# Patient Record
Sex: Female | Born: 1940 | Race: Black or African American | Hispanic: No | Smoking: Former smoker
Health system: Southern US, Community
[De-identification: ages and names within clinical notes are randomized; demographics above are authoritative.]

## PROBLEM LIST (undated history)

## (undated) DIAGNOSIS — I10 Essential (primary) hypertension: Secondary | ICD-10-CM

## (undated) DIAGNOSIS — E119 Type 2 diabetes mellitus without complications: Secondary | ICD-10-CM

## (undated) HISTORY — PX: ABDOMINAL HYSTERECTOMY: SHX81

---

## 2016-01-24 ENCOUNTER — Encounter (HOSPITAL_BASED_OUTPATIENT_CLINIC_OR_DEPARTMENT_OTHER): Payer: Self-pay

## 2016-01-24 ENCOUNTER — Emergency Department (HOSPITAL_BASED_OUTPATIENT_CLINIC_OR_DEPARTMENT_OTHER): Payer: Medicare Other

## 2016-01-24 ENCOUNTER — Emergency Department (HOSPITAL_BASED_OUTPATIENT_CLINIC_OR_DEPARTMENT_OTHER)
Admission: EM | Admit: 2016-01-24 | Discharge: 2016-01-24 | Disposition: A | Discharge status: Home / Self Care | Payer: Medicare Other | Attending: Emergency Medicine | Admitting: Emergency Medicine

## 2016-01-24 DIAGNOSIS — I10 Essential (primary) hypertension: Secondary | ICD-10-CM | POA: Insufficient documentation

## 2016-01-24 DIAGNOSIS — K8020 Calculus of gallbladder without cholecystitis without obstruction: Secondary | ICD-10-CM | POA: Diagnosis not present

## 2016-01-24 DIAGNOSIS — E119 Type 2 diabetes mellitus without complications: Secondary | ICD-10-CM | POA: Insufficient documentation

## 2016-01-24 DIAGNOSIS — Z87891 Personal history of nicotine dependence: Secondary | ICD-10-CM | POA: Diagnosis not present

## 2016-01-24 DIAGNOSIS — R1010 Upper abdominal pain, unspecified: Secondary | ICD-10-CM

## 2016-01-24 DIAGNOSIS — Z7984 Long term (current) use of oral hypoglycemic drugs: Secondary | ICD-10-CM | POA: Diagnosis not present

## 2016-01-24 DIAGNOSIS — R1011 Right upper quadrant pain: Secondary | ICD-10-CM | POA: Diagnosis present

## 2016-01-24 HISTORY — DX: Type 2 diabetes mellitus without complications: E11.9

## 2016-01-24 HISTORY — DX: Essential (primary) hypertension: I10

## 2016-01-24 LAB — URINALYSIS, ROUTINE W REFLEX MICROSCOPIC
Bilirubin Urine: NEGATIVE
Glucose, UA: 100 mg/dL — AB
Ketones, ur: NEGATIVE mg/dL
LEUKOCYTES UA: NEGATIVE
NITRITE: NEGATIVE
Protein, ur: >300 mg/dL — AB
SPECIFIC GRAVITY, URINE: 1.02 (ref 1.005–1.030)
pH: 6 (ref 5.0–8.0)

## 2016-01-24 LAB — COMPREHENSIVE METABOLIC PANEL
ALBUMIN: 3.6 g/dL (ref 3.5–5.0)
ALK PHOS: 51 U/L (ref 38–126)
ALT: 6 U/L — AB (ref 14–54)
AST: 14 U/L — AB (ref 15–41)
Anion gap: 7 (ref 5–15)
BUN: 56 mg/dL — ABNORMAL HIGH (ref 6–20)
CALCIUM: 9.3 mg/dL (ref 8.9–10.3)
CO2: 20 mmol/L — AB (ref 22–32)
CREATININE: 2.79 mg/dL — AB (ref 0.44–1.00)
Chloride: 112 mmol/L — ABNORMAL HIGH (ref 101–111)
GFR calc Af Amer: 18 mL/min — ABNORMAL LOW (ref >60)
GFR calc non Af Amer: 15 mL/min — ABNORMAL LOW (ref >60)
GLUCOSE: 152 mg/dL — AB (ref 65–99)
Potassium: 4.7 mmol/L (ref 3.5–5.1)
SODIUM: 139 mmol/L (ref 135–145)
Total Bilirubin: 0.2 mg/dL — ABNORMAL LOW (ref 0.3–1.2)
Total Protein: 7.5 g/dL (ref 6.5–8.1)

## 2016-01-24 LAB — URINE MICROSCOPIC-ADD ON

## 2016-01-24 LAB — CBC WITH DIFFERENTIAL/PLATELET
Basophils Absolute: 0 10*3/uL (ref 0.0–0.1)
Basophils Relative: 0 %
EOS ABS: 0.2 10*3/uL (ref 0.0–0.7)
EOS PCT: 3 %
HCT: 30 % — ABNORMAL LOW (ref 36.0–46.0)
HEMOGLOBIN: 9.6 g/dL — AB (ref 12.0–15.0)
LYMPHS ABS: 1.7 10*3/uL (ref 0.7–4.0)
Lymphocytes Relative: 29 %
MCH: 25.6 pg — AB (ref 26.0–34.0)
MCHC: 32 g/dL (ref 30.0–36.0)
MCV: 80 fL (ref 78.0–100.0)
MONOS PCT: 9 %
Monocytes Absolute: 0.5 10*3/uL (ref 0.1–1.0)
Neutro Abs: 3.4 10*3/uL (ref 1.7–7.7)
Neutrophils Relative %: 59 %
PLATELETS: 297 10*3/uL (ref 150–400)
RBC: 3.75 MIL/uL — ABNORMAL LOW (ref 3.87–5.11)
RDW: 16.1 % — ABNORMAL HIGH (ref 11.5–15.5)
WBC: 5.7 10*3/uL (ref 4.0–10.5)

## 2016-01-24 LAB — LIPASE, BLOOD: Lipase: 51 U/L (ref 11–51)

## 2016-01-24 MED ORDER — SODIUM CHLORIDE 0.9 % IV BOLUS (SEPSIS)
1000.0000 mL | Freq: Once | INTRAVENOUS | Status: AC
  Administered 2016-01-24: 1000 mL via INTRAVENOUS

## 2016-01-24 MED ORDER — SODIUM CHLORIDE 0.9 % IV BOLUS (SEPSIS)
500.0000 mL | Freq: Once | INTRAVENOUS | Status: AC
  Administered 2016-01-24: 500 mL via INTRAVENOUS

## 2016-01-24 MED ORDER — AMLODIPINE BESYLATE 5 MG PO TABS: 5.0000 mg | ORAL_TABLET | Freq: Every day | ORAL | Status: AC

## 2016-01-24 MED ORDER — HYDROCHLOROTHIAZIDE 25 MG PO TABS: 25.0000 mg | ORAL_TABLET | Freq: Every day | ORAL | Status: AC

## 2016-01-24 NOTE — ED Provider Notes (Signed)
CSN: [id number]     Arrival date & time 01/24/16  1402 History  By signing my name below, I, [facility], attest that this documentation has been prepared under the direction and in the presence of Sherwood Gambler, MD. Electronically Signed: Virgel Bouquet, ED Scribe. 01/24/2016. 5:22 PM.   Chief Complaint  Patient presents with  . Abdominal Pain   The history is provided by the patient and a relative. No language interpreter was used.   HPI Comments: Paula Mcdaniel is a 75 y.o. female with an hx of DM and HTN who presents to the Emergency Department complaining of intermittent, 5/10 RUQ onset 1 week ago. She notes that the pain radiates from her RUQ down to her RLQ and wraps around to her suprapubic abdomen. Pt states that the pain lasts for 5 minutes before resolving without treatment. Pain is unchanged by eating and palpation. She has taken Tylenol, most recently yesterday, with slight relief. She takes both lisinopril and metformin as prescribed. Daughter reports that the pt mostly drinks [employer] and does not drink much water. Denies hx of kidney conditions. She does not have a PCP currently but daughter notes that the pt has an appointment next week with [facility]. Denies nausea, vomiting, diarrhea, dysuria, hematuria, cough, CP, back pain, numbness, and weakness.  Past Medical History  Diagnosis Date  . Diabetes mellitus without complication ([place])   . Hypertension    Past Surgical History  Procedure Laterality Date  . Abdominal hysterectomy     No family history on file. Social History  Substance Use Topics  . Smoking status: Former Research scientist (life sciences)  . Smokeless tobacco: None  . Alcohol Use: No   OB History    No data available     Review of Systems  Respiratory: Negative for cough.   Cardiovascular: Negative for chest pain.  Gastrointestinal: Positive for abdominal pain. Negative for nausea, vomiting and diarrhea.  Genitourinary: Negative for dysuria and hematuria.   Musculoskeletal: Negative for back pain.  Neurological: Negative for weakness and numbness.  All other systems reviewed and are negative.   Allergies  Penicillins  Home Medications   Prior to Admission medications   Medication Sig Start Date End Date Taking? Authorizing Provider  lisinopril (PRINIVIL,ZESTRIL) 20 MG tablet Take 20 mg by mouth daily.   Yes Historical Provider, MD  metFORMIN (GLUCOPHAGE) 500 MG tablet Take by mouth 2 (two) times daily with a meal.   Yes Historical Provider, MD   BP 201/95 mmHg  Pulse 94  Temp(Src) 99.1 F (37.3 C) (Oral)  Resp 18  Ht 5\' 7"  (1.702 m)  Wt 207 lb (93.895 kg)  BMI 32.41 kg/m2  SpO2 100% Physical Exam  Constitutional: She is oriented to person, place, and time. She appears well-developed and well-nourished.  HENT:  Head: Normocephalic and atraumatic.  Right Ear: External ear normal.  Left Ear: External ear normal.  Nose: Nose normal.  Eyes: Right eye exhibits no discharge. Left eye exhibits no discharge.  Cardiovascular: Normal rate, regular rhythm and normal heart sounds.   Pulmonary/Chest: Effort normal and breath sounds normal.  Abdominal: Soft. There is no tenderness.  No abdominal tenderness.  Neurological: She is alert and oriented to person, place, and time.  Skin: Skin is warm and dry.  Nursing note and vitals reviewed.   ED Course  Procedures   DIAGNOSTIC STUDIES: Oxygen Saturation is 100% on RA, normal by my interpretation.    COORDINATION OF CARE: 3:40 PM Ordered labs. Will order renal stone study  CT and IV fluids. Discussed ordering pain medication and pt declined at this time. Discussed treatment plan with pt at bedside and pt agreed to plan.  5:14 PM Returned to speak with pt about lab and advanced imaging results.  Labs Review Labs Reviewed  URINALYSIS, ROUTINE W REFLEX MICROSCOPIC (NOT AT [facility]) - Abnormal; Notable for the following:    Glucose, UA 100 (*)    Hgb urine dipstick SMALL (*)    Protein,  ur >300 (*)    All other components within normal limits  COMPREHENSIVE METABOLIC PANEL - Abnormal; Notable for the following:    Chloride 112 (*)    CO2 20 (*)    Glucose, Bld 152 (*)    BUN 56 (*)    Creatinine, Ser 2.79 (*)    AST 14 (*)    ALT 6 (*)    Total Bilirubin 0.2 (*)    GFR calc non Af Amer 15 (*)    GFR calc Af Amer 18 (*)    All other components within normal limits  CBC WITH DIFFERENTIAL/PLATELET - Abnormal; Notable for the following:    RBC 3.75 (*)    Hemoglobin 9.6 (*)    HCT 30.0 (*)    MCH 25.6 (*)    RDW 16.1 (*)    All other components within normal limits  URINE MICROSCOPIC-ADD ON - Abnormal; Notable for the following:    Squamous Epithelial / LPF 6-30 (*)    Bacteria, UA FEW (*)    Casts HYALINE CASTS (*)    All other components within normal limits  LIPASE, BLOOD    Imaging Review Ct Renal Stone Study  01/24/2016  CLINICAL DATA:  Acute right lower quadrant abdominal pain. EXAM: CT ABDOMEN AND PELVIS WITHOUT CONTRAST TECHNIQUE: Multidetector CT imaging of the abdomen and pelvis was performed following the standard protocol without IV contrast. COMPARISON:  None. FINDINGS: Visualized lung bases are unremarkable. No significant osseous abnormality is noted. Minimal cholelithiasis is noted. No evidence of cholecystitis is noted. No focal abnormality is noted in the liver, spleen or pancreas on these unenhanced images. Adrenal glands appear normal. No hydronephrosis or renal obstruction is noted. No renal or ureteral calculi are noted. Atherosclerosis of abdominal aorta is noted without aneurysm formation. There is no evidence of bowel obstruction. Small exophytic right renal cyst is noted. The appendix is not visualized. No abnormal fluid collection is noted. Urinary bladder appears normal. Status post hysterectomy. Ovaries are not definitively visualized. IMPRESSION: Minimal cholelithiasis. Atherosclerosis of abdominal aorta without aneurysm formation. No  hydronephrosis or renal obstruction is noted. No renal or ureteral calculi are noted. Electronically Signed   By: Marijo Conception, M.D.   On: 01/24/2016 16:36   I have personally reviewed and evaluated these images and lab results as part of my medical decision-making.   EKG Interpretation None      MDM   Final diagnoses:  Upper abdominal pain  Calculus of gallbladder without cholecystitis without obstruction  Essential hypertension    Patient currently has minimal pain and declines pain meds. Has elevated Cr and anemia, but these are not far from baseline per Care Everywhere. Had BUN/Cr 49/2.2; Hgb 10.4 in 05/2015. She does not appear significantly volume depleted but given bump in creatinine was given IV fluids. She is tolerating oral well but doesn't drink much water, discussed importance of adequate hydration. She is also hypertensive. Given increasing creatinine, will d/c lisinopril and put on amlodipine (states she's done well with this in  past) and HCTZ. Discussed importance of PCP. Pain could possibly be coming from cholelithiasis. LFT and lipase benign. No chest symptoms. Given she feels well, will d/c with recommendations to f/u with surgeon.   I personally performed the services described in this documentation, which was scribed in my presence. The recorded information has been reviewed and is accurate.    Sherwood Gambler, MD 01/25/16 [id number]

## 2016-01-24 NOTE — ED Notes (Signed)
Pt placed on auto vitals Q30.  

## 2016-01-24 NOTE — ED Notes (Signed)
C/o right side abd pain intermittent x 1 week-denies n/v/d-presents to triage in w/c-uses cane-NAD

## 2016-01-24 NOTE — ED Notes (Signed)
Patient transported to CT 

## 2016-12-23 IMAGING — CT CT RENAL STONE PROTOCOL
2 of 4 series · 17 of 46 positions shown, 19 images · non-contrast
Comparison: None.

CLINICAL DATA: Acute right lower quadrant abdominal pain.

EXAM:
CT ABDOMEN AND PELVIS WITHOUT CONTRAST
TECHNIQUE: Multidetector CT imaging of the abdomen and pelvis was performed
following the standard protocol without IV contrast.

[Series 2: axial st · axial · 0.85mm/px · z∈[-422,-7]mm · 14 of 91 slices shown, 16 images]
[im 4/91  soft-tissue]
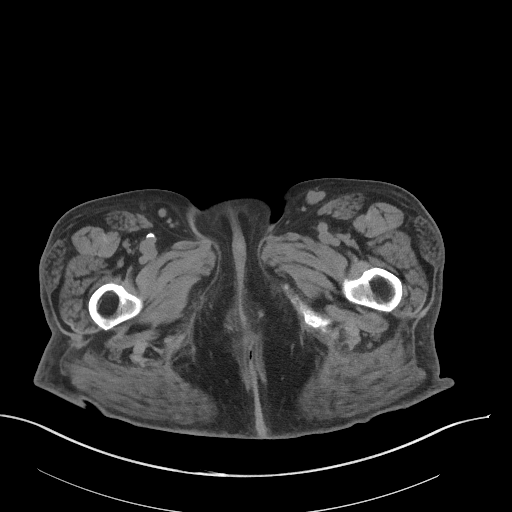
[im 4/91  bone]
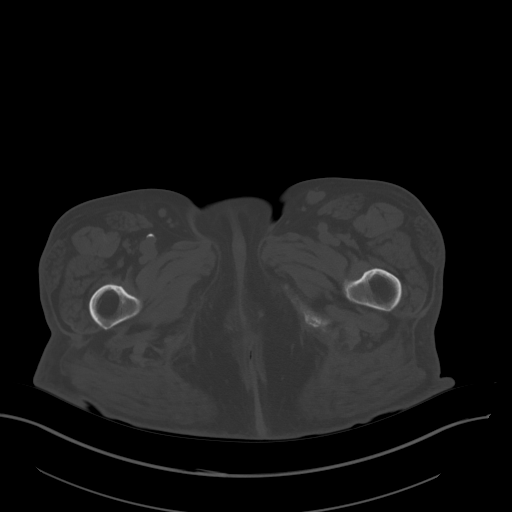
[im 12/91  soft-tissue]
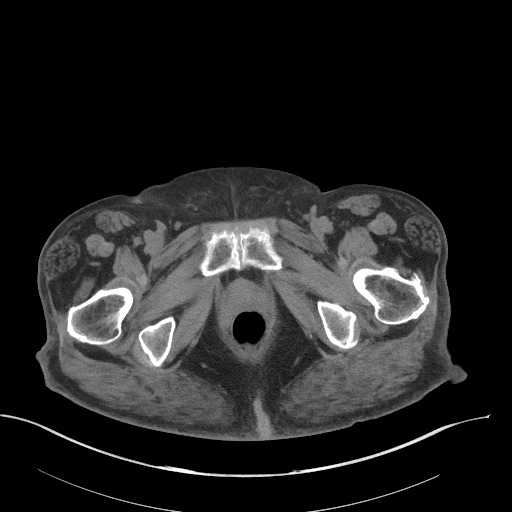
[im 19/91  soft-tissue]
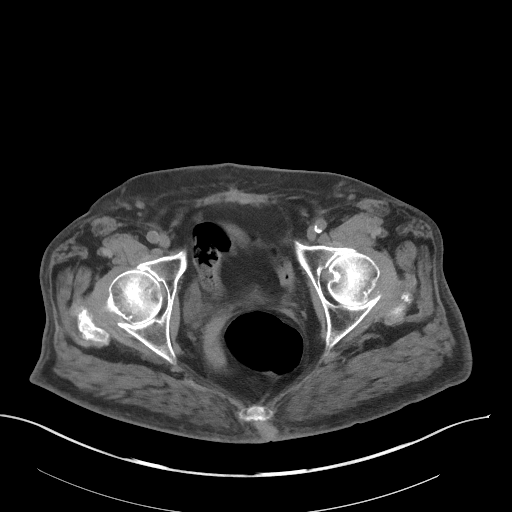
[im 23/91  soft-tissue]
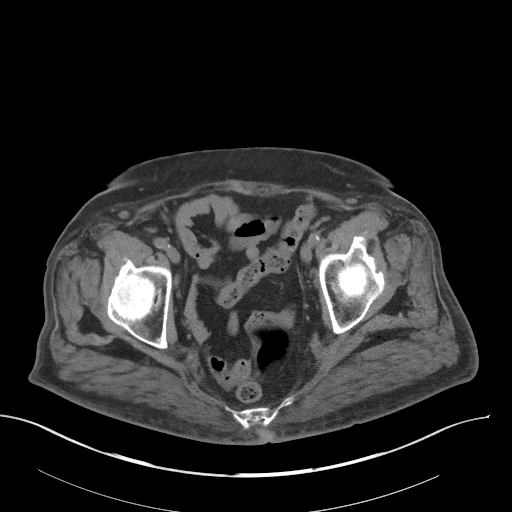
[im 31/91  soft-tissue]
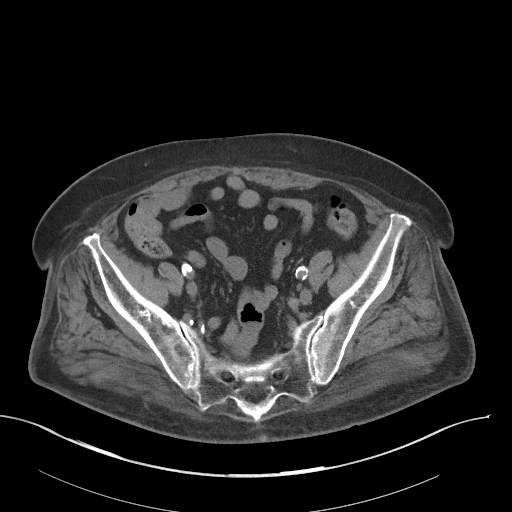
[im 38/91  soft-tissue]
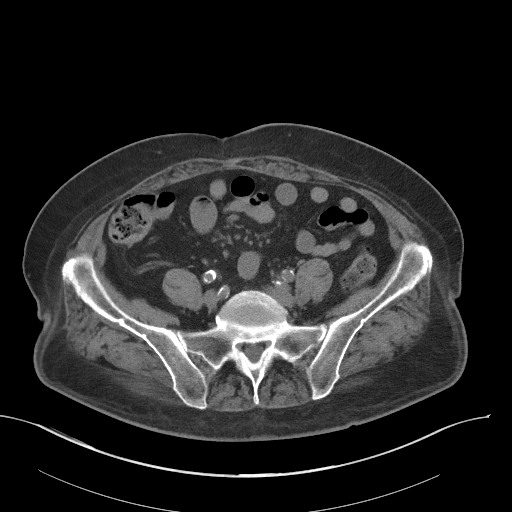
[im 42/91  soft-tissue]
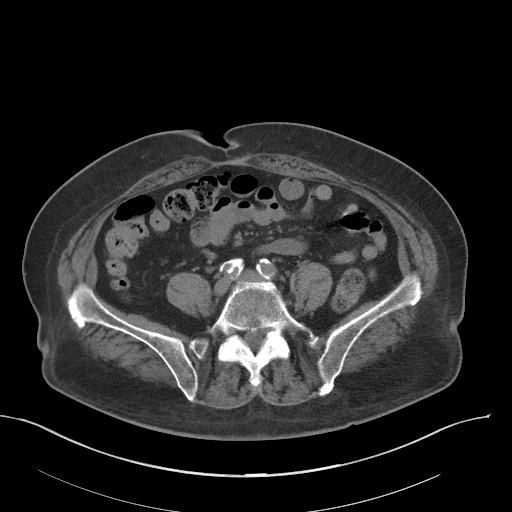
[im 49/91  soft-tissue]
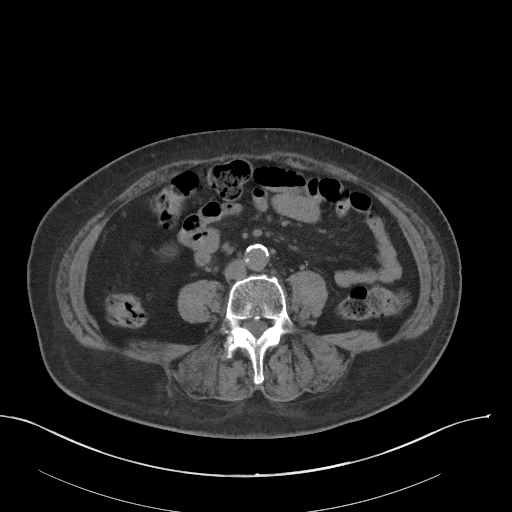
[im 53/91  soft-tissue]
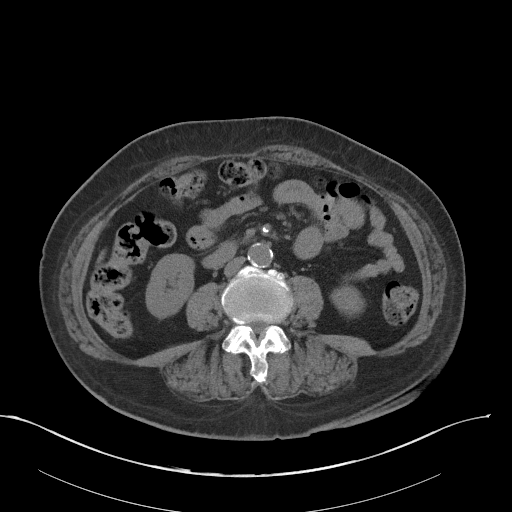
[im 53/91  bone]
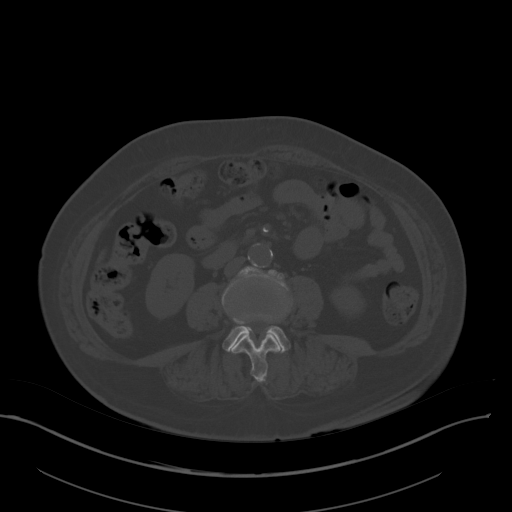
[im 61/91  soft-tissue]
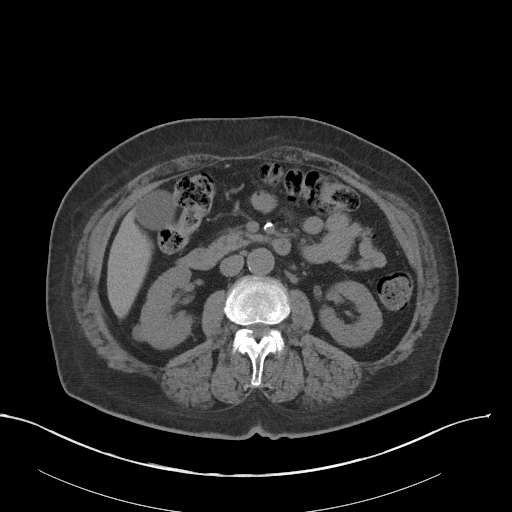
[im 68/91  soft-tissue]
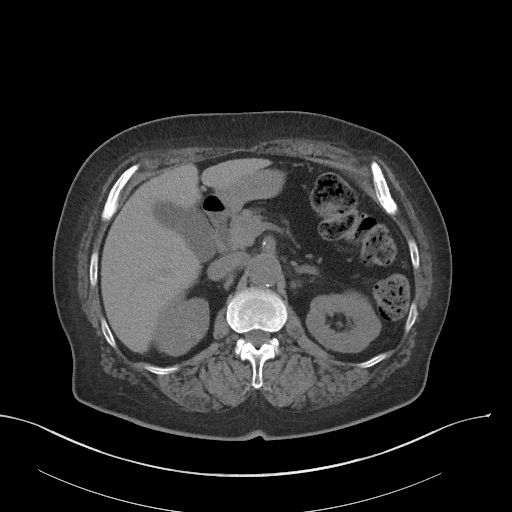
[im 72/91  soft-tissue]
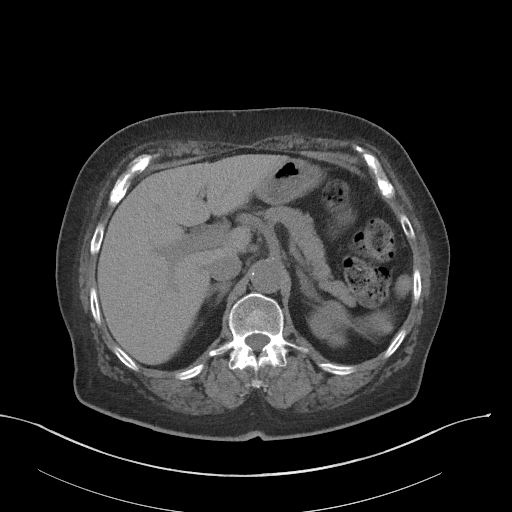
[im 79/91  soft-tissue]
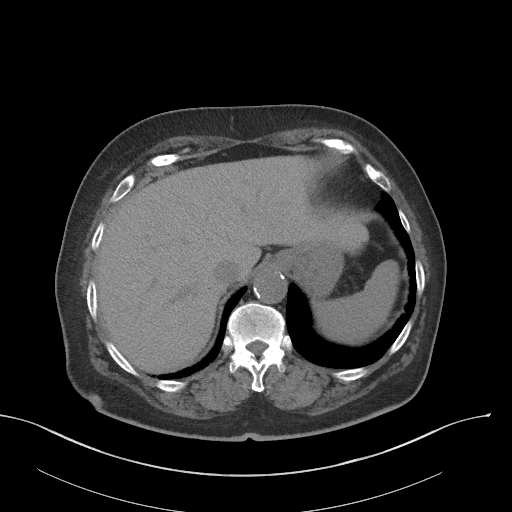
[im 87/91  soft-tissue]
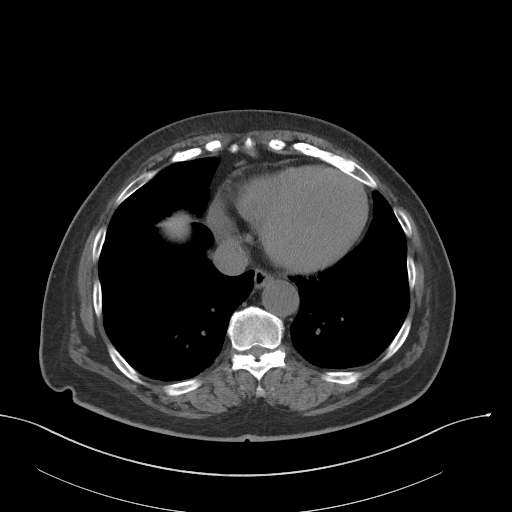

[Series 4: coronal st · coronal · 0.93mm/px · 3 of 87 slices shown]
[im 29/87  soft-tissue]
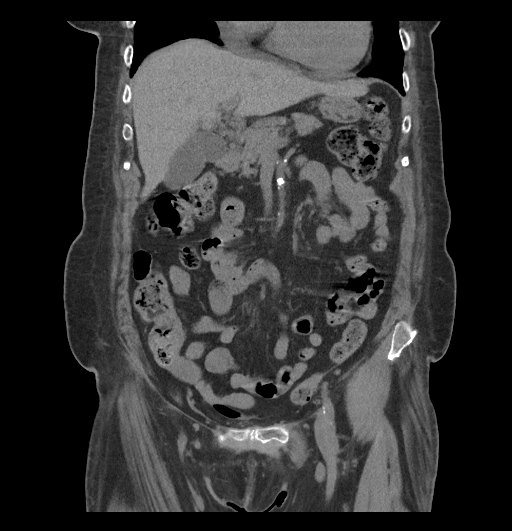
[im 39/87  soft-tissue]
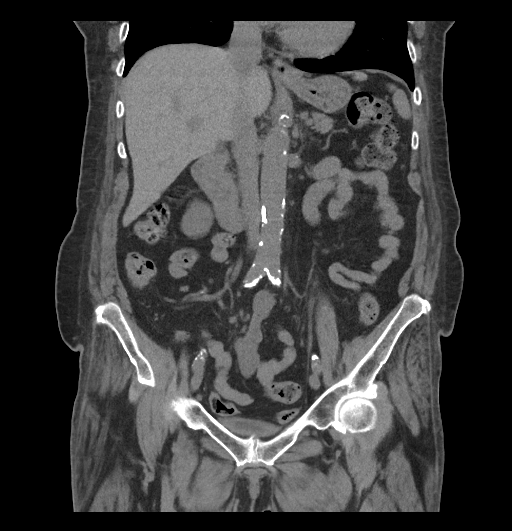
[im 48/87  soft-tissue]
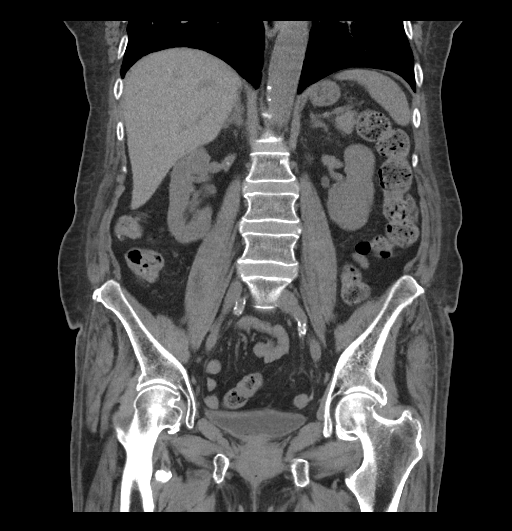

[17 of 46 positions shown; findings below may reference images not displayed]

FINDINGS: Visualized lung bases are unremarkable. No significant osseous
abnormality is noted.

Minimal cholelithiasis is noted. No evidence of cholecystitis is
noted. No focal abnormality is noted in the liver, spleen or
pancreas on these unenhanced images. Adrenal glands appear normal.
No hydronephrosis or renal obstruction is noted. No renal or
ureteral calculi are noted. Atherosclerosis of abdominal aorta is
noted without aneurysm formation. There is no evidence of bowel
obstruction. Small exophytic right renal cyst is noted. The appendix
is not visualized. No abnormal fluid collection is noted. Urinary
bladder appears normal. Status post hysterectomy. Ovaries are not
definitively visualized.
IMPRESSION: Minimal cholelithiasis.

Atherosclerosis of abdominal aorta without aneurysm formation.

No hydronephrosis or renal obstruction is noted. No renal or
ureteral calculi are noted.

## 2018-04-05 ENCOUNTER — Other Ambulatory Visit: Payer: Self-pay

## 2018-04-05 ENCOUNTER — Encounter (HOSPITAL_COMMUNITY): Payer: Self-pay

## 2018-04-05 ENCOUNTER — Emergency Department (HOSPITAL_COMMUNITY): Payer: Medicare Other

## 2018-04-05 ENCOUNTER — Emergency Department (HOSPITAL_COMMUNITY)
Admission: EM | Admit: 2018-04-05 | Discharge: 2018-04-05 | Disposition: A | Discharge status: Home / Self Care | Payer: Medicare Other | Attending: Emergency Medicine | Admitting: Emergency Medicine

## 2018-04-05 DIAGNOSIS — I10 Essential (primary) hypertension: Secondary | ICD-10-CM | POA: Insufficient documentation

## 2018-04-05 DIAGNOSIS — R5383 Other fatigue: Secondary | ICD-10-CM | POA: Insufficient documentation

## 2018-04-05 DIAGNOSIS — Z87891 Personal history of nicotine dependence: Secondary | ICD-10-CM | POA: Diagnosis not present

## 2018-04-05 DIAGNOSIS — Z79899 Other long term (current) drug therapy: Secondary | ICD-10-CM | POA: Diagnosis not present

## 2018-04-05 DIAGNOSIS — C159 Malignant neoplasm of esophagus, unspecified: Secondary | ICD-10-CM

## 2018-04-05 DIAGNOSIS — M10031 Idiopathic gout, right wrist: Secondary | ICD-10-CM | POA: Insufficient documentation

## 2018-04-05 DIAGNOSIS — E119 Type 2 diabetes mellitus without complications: Secondary | ICD-10-CM | POA: Diagnosis not present

## 2018-04-05 DIAGNOSIS — M109 Gout, unspecified: Secondary | ICD-10-CM

## 2018-04-05 DIAGNOSIS — R531 Weakness: Secondary | ICD-10-CM | POA: Insufficient documentation

## 2018-04-05 DIAGNOSIS — M10071 Idiopathic gout, right ankle and foot: Secondary | ICD-10-CM | POA: Diagnosis not present

## 2018-04-05 LAB — COMPREHENSIVE METABOLIC PANEL
ALT: 18 U/L (ref 0–44)
ANION GAP: 13 (ref 5–15)
AST: 29 U/L (ref 15–41)
Albumin: 2.8 g/dL — ABNORMAL LOW (ref 3.5–5.0)
Alkaline Phosphatase: 64 U/L (ref 38–126)
BUN: 67 mg/dL — ABNORMAL HIGH (ref 8–23)
CHLORIDE: 110 mmol/L (ref 98–111)
CO2: 16 mmol/L — AB (ref 22–32)
CREATININE: 4.38 mg/dL — AB (ref 0.44–1.00)
Calcium: 8.6 mg/dL — ABNORMAL LOW (ref 8.9–10.3)
GFR calc non Af Amer: 9 mL/min — ABNORMAL LOW (ref >60)
GFR, EST AFRICAN AMERICAN: 10 mL/min — AB (ref >60)
Glucose, Bld: 101 mg/dL — ABNORMAL HIGH (ref 70–99)
POTASSIUM: 5 mmol/L (ref 3.5–5.1)
SODIUM: 139 mmol/L (ref 135–145)
Total Bilirubin: 0.6 mg/dL (ref 0.3–1.2)
Total Protein: 6.5 g/dL (ref 6.5–8.1)

## 2018-04-05 LAB — URINALYSIS, ROUTINE W REFLEX MICROSCOPIC
Bacteria, UA: NONE SEEN
Bilirubin Urine: NEGATIVE
GLUCOSE, UA: 50 mg/dL — AB
Hgb urine dipstick: NEGATIVE
KETONES UR: NEGATIVE mg/dL
Leukocytes, UA: NEGATIVE
Nitrite: NEGATIVE
PH: 5 (ref 5.0–8.0)
Protein, ur: >=300 mg/dL — AB
SPECIFIC GRAVITY, URINE: 1.014 (ref 1.005–1.030)

## 2018-04-05 LAB — CBC WITH DIFFERENTIAL/PLATELET
BASOS ABS: 0 10*3/uL (ref 0.0–0.1)
Basophils Relative: 0 %
EOS ABS: 0 10*3/uL (ref 0.0–0.7)
Eosinophils Relative: 0 %
HEMATOCRIT: 25.9 % — AB (ref 36.0–46.0)
Hemoglobin: 7.9 g/dL — ABNORMAL LOW (ref 12.0–15.0)
LYMPHS ABS: 0.6 10*3/uL — AB (ref 0.7–4.0)
Lymphocytes Relative: 3 %
MCH: 26.2 pg (ref 26.0–34.0)
MCHC: 30.5 g/dL (ref 30.0–36.0)
MCV: 86 fL (ref 78.0–100.0)
MONOS PCT: 3 %
Monocytes Absolute: 0.6 10*3/uL (ref 0.1–1.0)
Neutro Abs: 17.5 10*3/uL — ABNORMAL HIGH (ref 1.7–7.7)
Neutrophils Relative %: 94 %
Platelets: 201 10*3/uL (ref 150–400)
RBC: 3.01 MIL/uL — AB (ref 3.87–5.11)
RDW: 18.7 % — ABNORMAL HIGH (ref 11.5–15.5)
WBC: 18.7 10*3/uL — AB (ref 4.0–10.5)

## 2018-04-05 LAB — I-STAT CG4 LACTIC ACID, ED: LACTIC ACID, VENOUS: 0.55 mmol/L (ref 0.5–1.9)

## 2018-04-05 LAB — PROTIME-INR
INR: 1.27
PROTHROMBIN TIME: 15.8 s — AB (ref 11.4–15.2)

## 2018-04-05 MED ORDER — OXYCODONE-ACETAMINOPHEN 5-325 MG PO TABS
1.0000 | ORAL_TABLET | Freq: Once | ORAL | Status: AC
Start: 2018-04-05 — End: 2018-04-05
  Administered 2018-04-05: 1 via ORAL
  Filled 2018-04-05: qty 1

## 2018-04-05 MED ORDER — OXYCODONE HCL 5 MG PO TABS: 2.5000 mg | ORAL_TABLET | ORAL | 0 refills | Status: AC | PRN

## 2018-04-05 NOTE — ED Provider Notes (Signed)
[facility] EMERGENCY DEPARTMENT Provider Note  CSN: [id number] Arrival date & time: 04/05/18 0145  Chief Complaint(s) Fatigue and Ankle Pain  HPI Paula Mcdaniel is a 77 y.o. female a history of hypertension, diabetes, esophageal cancers currently undergoing chemo therapy and radiation.  Last dose of chemotherapy was early this week.  Patient also gets Neulasta.  She reports that for the last 2 days she is had some generalized fatigue.  No alleviating or aggravating factors.  No associated fevers or chills.  No nausea or vomiting.  States that she is able to eat appropriately.  Denies any recent weight loss.  No chest pain or shortness of breath.  No abdominal pain.  No diarrhea.  No urinary symptoms.  Patient has had difficulty ambulating for the past couple of days due to right foot pain.  States that she was seen last week for right wrist pain diagnosed with gout exacerbation.  Reports that the ankle pain is exacerbated with ambulation, range of motion, and palpation of the right ankle.  No alleviating factors.  Patient has been taking Motrin without relief.    HPI  Past Medical History Past Medical History:  Diagnosis Date  . Diabetes mellitus without complication ([place])   . Hypertension    There are no active problems to display for this patient.  Home Medication(s) Prior to Admission medications   Medication Sig Start Date End Date Taking? Authorizing Provider  amLODipine (NORVASC) 10 MG tablet Take 10 mg by mouth daily. 03/22/18  Yes [provider]  atorvastatin (LIPITOR) 10 MG tablet Take 10 mg by mouth every evening. 03/02/18  Yes [provider]  diclofenac sodium (VOLTAREN) 1 % GEL Apply 2 g topically 2 (two) times daily. 03/22/18  Yes [provider]  fluticasone (FLONASE) 50 MCG/ACT nasal spray Place 1 spray into both nostrils daily.  06/07/16  Yes [provider]  hydrALAZINE (APRESOLINE) 50 MG tablet Take 50 mg by mouth 3  (three) times daily. 03/22/18  Yes [provider]  isosorbide mononitrate (IMDUR) 60 MG 24 hr tablet Take 60 mg by mouth daily. 03/22/18  Yes [provider]  latanoprost (XALATAN) 0.005 % ophthalmic solution Place 1 drop into both eyes at bedtime. 06/19/16  Yes [provider]  amLODipine (NORVASC) 5 MG tablet Take 1 tablet (5 mg total) by mouth daily. Patient not taking: Reported on 04/05/2018 01/24/16   Sherwood Gambler, MD  hydrochlorothiazide (HYDRODIURIL) 25 MG tablet Take 1 tablet (25 mg total) by mouth daily. Patient not taking: Reported on 04/05/2018 01/24/16   Sherwood Gambler, MD  oxyCODONE (ROXICODONE) 5 MG immediate release tablet Take 0.5-1 tablets (2.5-5 mg total) by mouth every 4 (four) hours as needed for up to 3 days for severe pain. 04/05/18 04/08/18  Fatima Blank, MD  lisinopril (PRINIVIL,ZESTRIL) 20 MG tablet Take 20 mg by mouth daily.  01/24/16  [provider]  Past Surgical History Past Surgical History:  Procedure Laterality Date  . ABDOMINAL HYSTERECTOMY     Family History History reviewed. No pertinent family history.  Social History Social History   Tobacco Use  . Smoking status: Former Smoker  Substance Use Topics  . Alcohol use: No  . Drug use: No   Allergies Penicillins  Review of Systems Review of Systems All other systems are reviewed and are negative for acute change except as noted in the HPI  Physical Exam Vital Signs  I have reviewed the triage vital signs BP (!) 147/58 (BP Location: Right Arm)   Pulse 92   Temp 99.3 F (37.4 C)   Resp 18   Ht 5\' 6"  (1.676 m)   Wt 72.6 kg (160 lb)   SpO2 100%   BMI 25.82 kg/m   Physical Exam  Constitutional: She is oriented to person, place, and time. She appears well-developed and well-nourished. No distress.  HENT:  Head:  Normocephalic and atraumatic.  Nose: Nose normal.  Eyes: Pupils are equal, round, and reactive to light. Conjunctivae and EOM are normal. Right eye exhibits no discharge. Left eye exhibits no discharge. No scleral icterus.  Neck: Normal range of motion. Neck supple.  Cardiovascular: Normal rate and regular rhythm. Exam reveals no gallop and no friction rub.  No murmur heard. Pulmonary/Chest: Effort normal and breath sounds normal. No stridor. No respiratory distress. She has no rales.  Abdominal: Soft. She exhibits no distension. There is no tenderness.  Musculoskeletal: She exhibits no edema.       Right wrist: She exhibits decreased range of motion, tenderness and swelling.       Right ankle: She exhibits decreased range of motion and swelling. Tenderness.  Neurological: She is alert and oriented to person, place, and time. She has normal strength. No sensory deficit.  Skin: Skin is warm and dry. No rash noted. She is not diaphoretic. No erythema.  Psychiatric: She has a normal mood and affect.  Vitals reviewed.   ED Results and Treatments Labs (all labs ordered are listed, but only abnormal results are displayed) Labs Reviewed  COMPREHENSIVE METABOLIC PANEL - Abnormal; Notable for the following components:      Result Value   CO2 16 (*)    Glucose, Bld 101 (*)    BUN 67 (*)    Creatinine, Ser 4.38 (*)    Calcium 8.6 (*)    Albumin 2.8 (*)    GFR calc non Af Amer 9 (*)    GFR calc Af Amer 10 (*)    All other components within normal limits  CBC WITH DIFFERENTIAL/PLATELET - Abnormal; Notable for the following components:   WBC 18.7 (*)    RBC 3.01 (*)    Hemoglobin 7.9 (*)    HCT 25.9 (*)    RDW 18.7 (*)    Neutro Abs 17.5 (*)    Lymphs Abs 0.6 (*)    All other components within normal limits  PROTIME-INR - Abnormal; Notable for the following components:   Prothrombin Time 15.8 (*)    All other components within normal limits  URINALYSIS, ROUTINE W REFLEX MICROSCOPIC -  Abnormal; Notable for the following components:   Glucose, UA 50 (*)    Protein, ur >=300 (*)    All other components within normal limits  URINE CULTURE  I-STAT CG4 LACTIC ACID, ED  I-STAT CG4 LACTIC ACID, ED  EKG  EKG Interpretation  Date/Time:    Ventricular Rate:    PR Interval:    QRS Duration:   QT Interval:    QTC Calculation:   R Axis:     Text Interpretation:        Radiology Dg Chest 2 View  Result Date: 04/05/2018 CLINICAL DATA:  Fever and weakness for 1 week. History of esophageal cancer. EXAM: CHEST - 2 VIEW COMPARISON:  None. FINDINGS: Power port type central venous catheter with tip over the cavoatrial junction region. No pneumothorax. Cardiac enlargement. Pulmonary vascularity is normal. No edema, consolidation, or focal lesion. Mediastinal contours appear intact. No blunting of costophrenic angles. Degenerative changes in the spine and shoulders. IMPRESSION: Cardiac enlargement. No evidence of active pulmonary disease. Electronically Signed   By: Lucienne Capers M.D.   On: 04/05/2018 02:26   Pertinent labs & imaging results that were available during my care of the patient were reviewed by me and considered in my medical decision making (see chart for details).  Medications Ordered in ED Medications  oxyCODONE-acetaminophen (PERCOCET/ROXICET) 5-325 MG per tablet 1 tablet (1 tablet Oral Given 04/05/18 0324)                                                                                                                                    Procedures Procedures  (including critical care time)  Medical Decision Making / ED Course I have reviewed the nursing notes for this encounter and the patient's prior records (if available in EHR or on provided paperwork).    Patient presents with generalized fatigue in the setting of chemotherapy for  esophageal cancer.  Patient is afebrile with stable vital signs.  She is well-appearing, well-hydrated and nontoxic.  Screening labs reassuring.  Leukocytosis likely due to Neulasta.  Stable hemoglobin and renal function.  No significant electrolyte derangements.  No decreased oral intake.  No associated chest pain shortness of breath.  No abdominal pain, nausea, vomiting.  The main concern for patient and family is her inability to ambulate due to her ankle pain.  This appears to be a gout flare.  Reports that Motrin is not helping.  Will provide with short course of narcotic medication.  Recommend close follow-up with PCP and oncologist for continued management.  [facility] narcotic database reviewed and no active Rx noted.  The patient appears reasonably screened and/or stabilized for discharge and I doubt any other medical condition or other [facility] requiring further screening, evaluation, or treatment in the ED at this time prior to discharge.  The patient is safe for discharge with strict return precautions.    Final Clinical Impression(s) / ED Diagnoses Final diagnoses:  Other fatigue  Malignant neoplasm of esophagus, unspecified location [facility])  Acute gout of right wrist, unspecified cause  Acute gout of right ankle, unspecified cause   Disposition: Discharge  Condition: Good  I have discussed the results, Dx and Tx plan  with the patient who expressed understanding and agree(s) with the plan. Discharge instructions discussed at great length. The patient was given strict return precautions who verbalized understanding of the instructions. No further questions at time of discharge.    ED Discharge Orders        Ordered    oxyCODONE (ROXICODONE) 5 MG immediate release tablet  Every 4 hours PRN     04/05/18 0540       Follow Up: Ky Barban, [place] [address] [phone]  Schedule an appointment as soon as possible for a  visit  in 3-5 days  Oncology  Schedule an appointment as soon as possible for a visit  As needed      This chart was dictated using voice recognition software.  Despite best efforts to proofread,  errors can occur which can change the documentation meaning.   Fatima Blank, MD 04/05/18 [phone]

## 2018-04-05 NOTE — ED Triage Notes (Signed)
Pt BIB [facility] for eval of progressively worsening weakness this week. Pt is currently receiving chemo for esophageal cancer, last treatment was yesterday. EMS reports pt is slightly confused and warm to touch. Foul smelling urine noted on arrival

## 2018-04-06 LAB — URINE CULTURE: Culture: NO GROWTH

## 2019-03-12 DEATH — deceased
